# Patient Record
Sex: Male | Born: 1985 | Race: White | Hispanic: No | Marital: Single | Smoking: Former smoker
Health system: Southern US, Community
[De-identification: ages and names within clinical notes are randomized; demographics above are authoritative.]

## PROBLEM LIST (undated history)

## (undated) HISTORY — PX: ANKLE SURGERY: SHX546

## (undated) HISTORY — PX: ADENOIDECTOMY: SHX5191

---

## 2006-05-06 ENCOUNTER — Emergency Department: Payer: Self-pay | Admitting: Emergency Medicine

## 2007-06-07 ENCOUNTER — Emergency Department: Payer: Self-pay | Admitting: Emergency Medicine

## 2007-09-18 ENCOUNTER — Emergency Department: Payer: Self-pay | Admitting: Emergency Medicine

## 2008-02-16 IMAGING — CR DG HAND COMPLETE 3+V*L*
1 series · 4 of 4 positions shown · non-contrast
Comparison: none

REASON FOR EXAM: Injury
COMMENTS:

PROCEDURE:     DXR - DXR HAND LT COMPLETE  W/OBLIQUES  - May 06, 2006  [DATE]
RESULT:     Three views of the LEFT hand reveal no fracture, dislocation, or
other acute bony abnormality.  No radiopaque foreign body is identified.

[Series 1: view not recorded · 0.17mm/px · 4 of 4 slices shown]
[im 1/4]
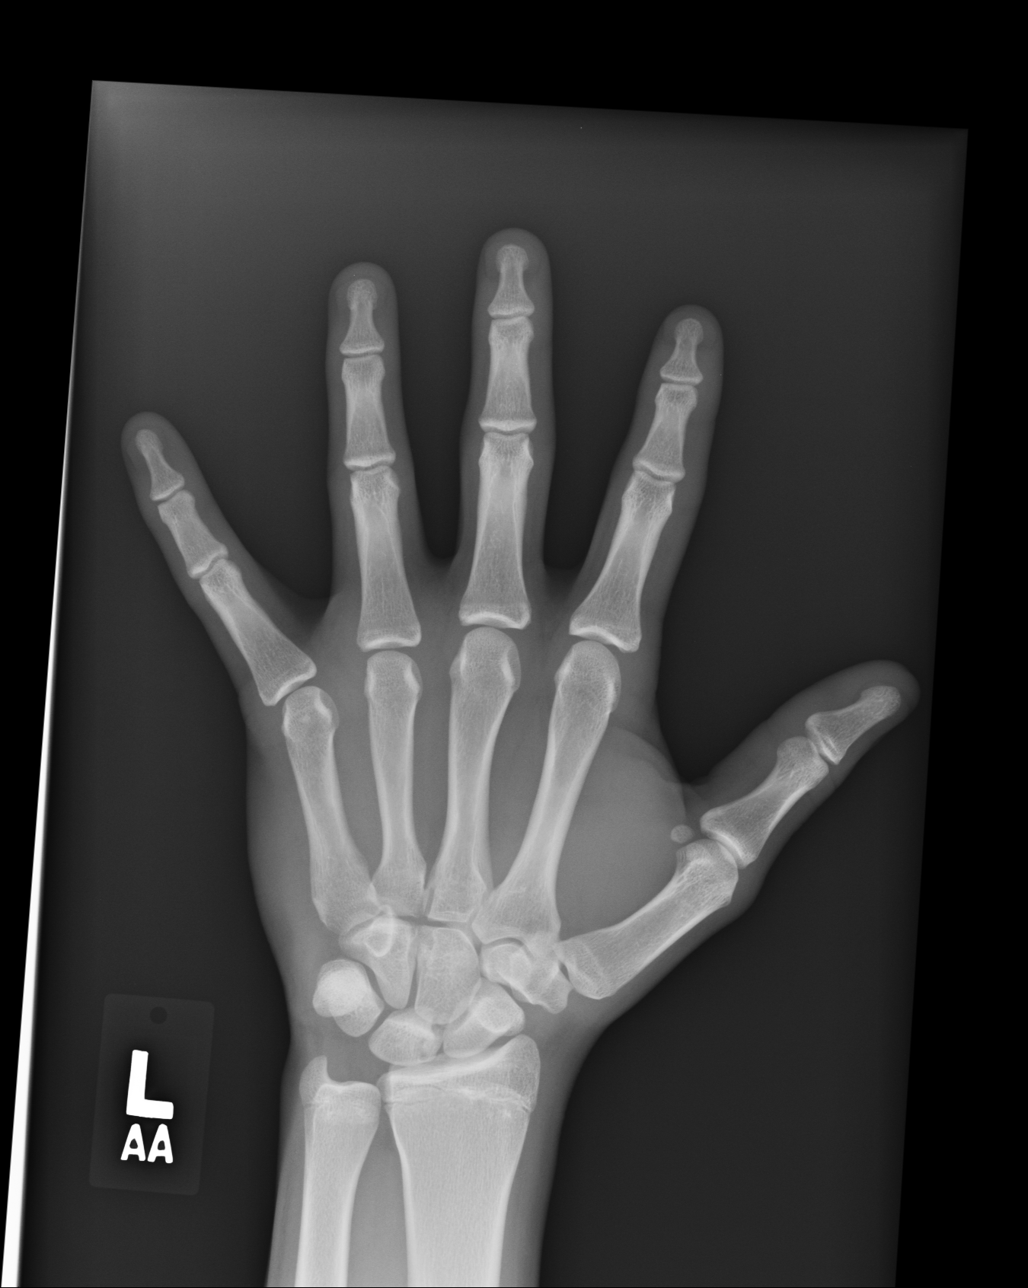
[im 2/4]
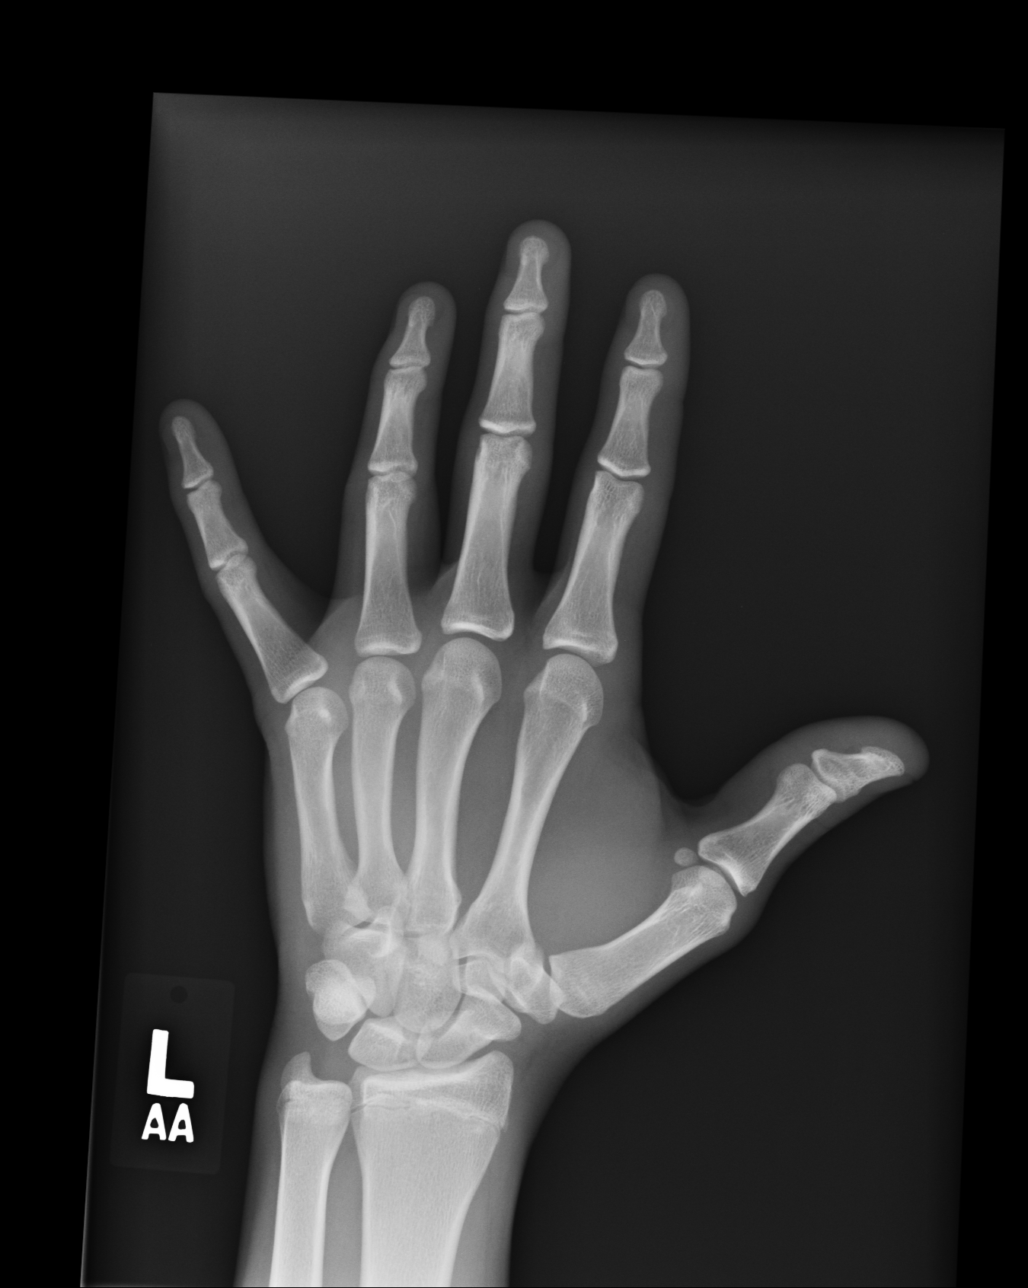
[im 3/4]
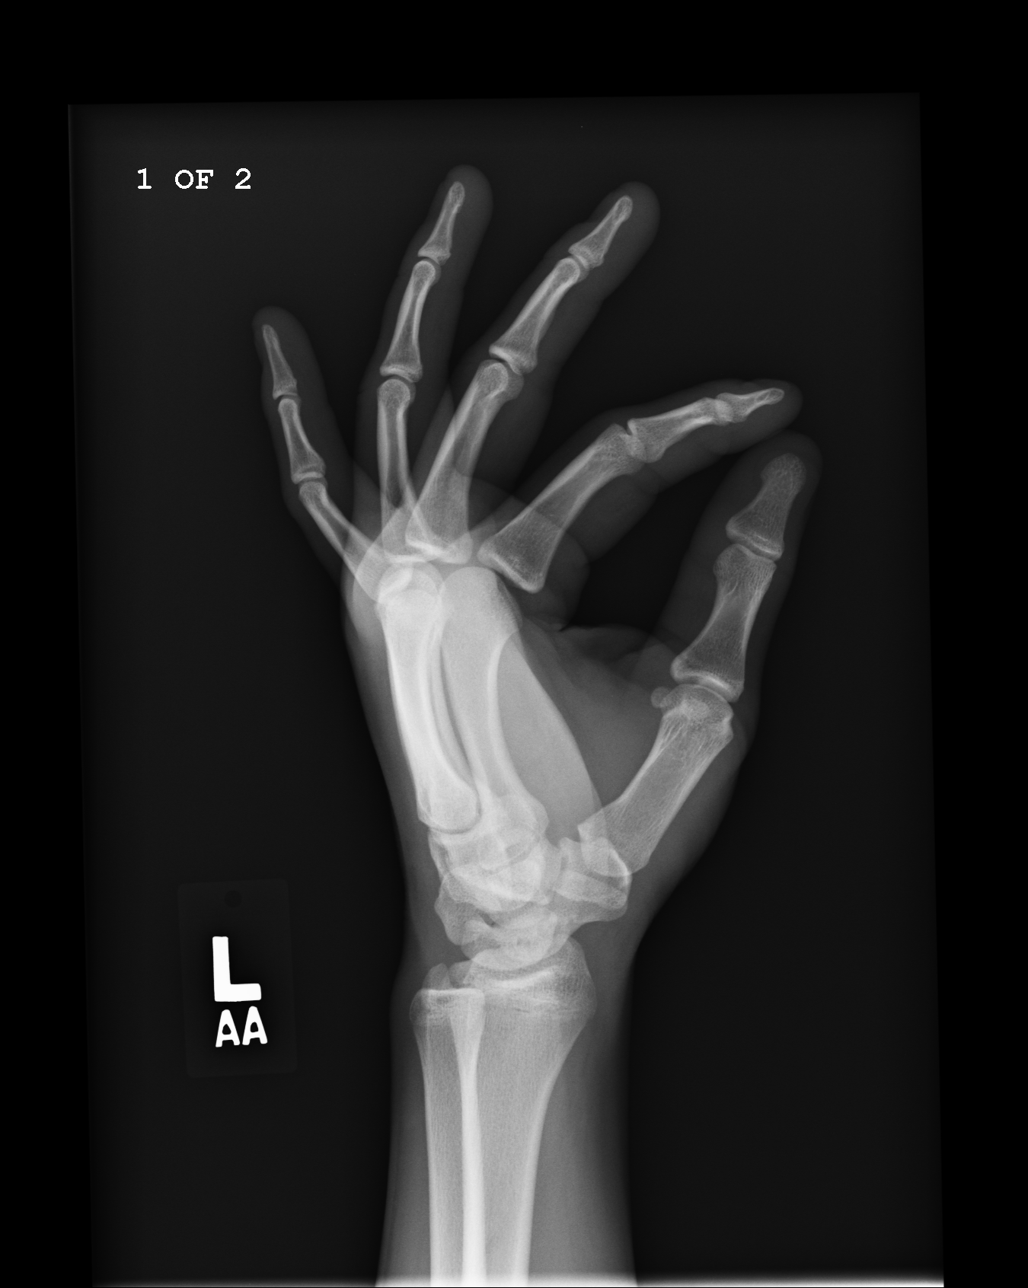
[im 4/4]
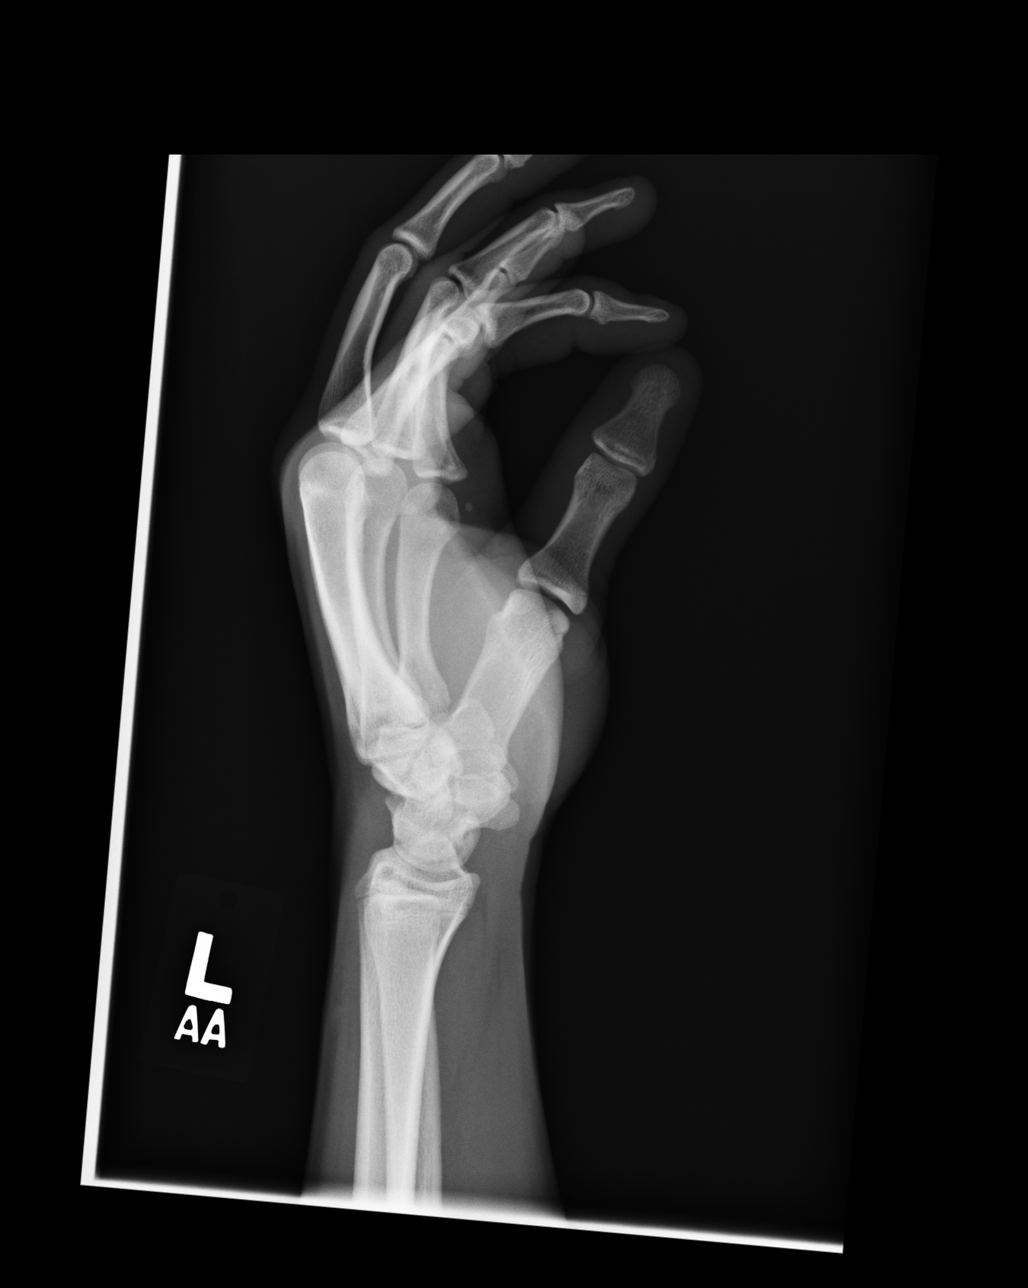

[4 of 4 positions shown; findings below may reference images not displayed]

IMPRESSION: No significant abnormalities are seen.

## 2009-09-08 ENCOUNTER — Emergency Department: Payer: Self-pay | Admitting: Internal Medicine

## 2011-03-31 ENCOUNTER — Emergency Department: Payer: Self-pay | Admitting: Internal Medicine

## 2013-03-10 ENCOUNTER — Emergency Department: Payer: Self-pay | Admitting: Emergency Medicine

## 2013-07-12 ENCOUNTER — Emergency Department: Payer: Self-pay | Admitting: Internal Medicine

## 2015-04-04 ENCOUNTER — Encounter: Payer: Self-pay | Admitting: Emergency Medicine

## 2015-04-04 ENCOUNTER — Emergency Department
Admission: EM | Admit: 2015-04-04 | Discharge: 2015-04-04 | Disposition: A | Discharge status: Home / Self Care | Payer: Self-pay | Attending: Emergency Medicine | Admitting: Emergency Medicine

## 2015-04-04 DIAGNOSIS — Z87891 Personal history of nicotine dependence: Secondary | ICD-10-CM | POA: Insufficient documentation

## 2015-04-04 DIAGNOSIS — Y9389 Activity, other specified: Secondary | ICD-10-CM | POA: Insufficient documentation

## 2015-04-04 DIAGNOSIS — X58XXXA Exposure to other specified factors, initial encounter: Secondary | ICD-10-CM | POA: Insufficient documentation

## 2015-04-04 DIAGNOSIS — S0502XA Injury of conjunctiva and corneal abrasion without foreign body, left eye, initial encounter: Secondary | ICD-10-CM | POA: Insufficient documentation

## 2015-04-04 DIAGNOSIS — Y9289 Other specified places as the place of occurrence of the external cause: Secondary | ICD-10-CM | POA: Insufficient documentation

## 2015-04-04 DIAGNOSIS — Y998 Other external cause status: Secondary | ICD-10-CM | POA: Insufficient documentation

## 2015-04-04 MED ORDER — TETRACAINE HCL 0.5 % OP SOLN
OPHTHALMIC | Status: AC
  Filled 2015-04-04: qty 2

## 2015-04-04 MED ORDER — EYE WASH OPHTH SOLN
OPHTHALMIC | Status: AC
  Filled 2015-04-04: qty 118

## 2015-04-04 MED ORDER — FLUORESCEIN SODIUM 1 MG OP STRP
ORAL_STRIP | OPHTHALMIC | Status: AC
  Filled 2015-04-04: qty 1

## 2015-04-04 MED ORDER — GENTAMICIN SULFATE 0.3 % OP SOLN: 1.0000 [drp] | Freq: Three times a day (TID) | OPHTHALMIC | Status: AC

## 2015-04-04 NOTE — Discharge Instructions (Signed)
Corneal Abrasion °The cornea is the clear covering at the front and center of the eye. When looking at the colored portion of the eye (iris), you are looking through the cornea. This very thin tissue is made up of many layers. The surface layer is a single layer of cells (corneal epithelium) and is one of the most sensitive tissues in the body. If a scratch or injury causes the corneal epithelium to come off, it is called a corneal abrasion. If the injury extends to the tissues below the epithelium, the condition is called a corneal ulcer. °CAUSES  °· Scratches. °· Trauma. °· Foreign body in the eye. °Some people have recurrences of abrasions in the area of the original injury even after it has healed (recurrent erosion syndrome). Recurrent erosion syndrome generally improves and goes away with time. °SYMPTOMS  °· Eye pain. °· Difficulty or inability to keep the injured eye open. °· The eye becomes very sensitive to light. °· Recurrent erosions tend to happen suddenly, first thing in the morning, usually after waking up and opening the eye. °DIAGNOSIS  °Your health care provider can diagnose a corneal abrasion during an eye exam. Dye is usually placed in the eye using a drop or a small paper strip moistened by your tears. When the eye is examined with a special light, the abrasion shows up clearly because of the dye. °TREATMENT  °· Small abrasions may be treated with antibiotic drops or ointment alone. °· A pressure patch may be put over the eye. If this is done, follow your doctor's instructions for when to remove the patch. Do not drive or use machines while the eye patch is on. Judging distances is hard to do with a patch on. °If the abrasion becomes infected and spreads to the deeper tissues of the cornea, a corneal ulcer can result. This is serious because it can cause corneal scarring. Corneal scars interfere with light passing through the cornea and cause a loss of vision in the involved eye. °HOME CARE  INSTRUCTIONS °· Use medicine or ointment as directed. Only take over-the-counter or prescription medicines for pain, discomfort, or fever as directed by your health care provider. °· Do not drive or operate machinery if your eye is patched. Your ability to judge distances is impaired. °· If your health care provider has given you a follow-up appointment, it is very important to keep that appointment. Not keeping the appointment could result in a severe eye infection or permanent loss of vision. If there is any problem keeping the appointment, let your health care provider know. °SEEK MEDICAL CARE IF:  °· You have pain, light sensitivity, and a scratchy feeling in one eye or both eyes. °· Your pressure patch keeps loosening up, and you can blink your eye under the patch after treatment. °· Any kind of discharge develops from the eye after treatment or if the lids stick together in the morning. °· You have the same symptoms in the morning as you did with the original abrasion days, weeks, or months after the abrasion healed. °MAKE SURE YOU:  °· Understand these instructions. °· Will watch your condition. °· Will get help right away if you are not doing well or get worse. °Document Released: 10/12/2000 Document Revised: 10/20/2013 Document Reviewed: 06/22/2013 °[employer]® Patient Information ©2015 [employer]. This information is not intended to replace advice given to you by your health care provider. Make sure you discuss any questions you have with your health care provider. ° °

## 2015-04-04 NOTE — ED Notes (Signed)
States thinks got grass in eye

## 2015-04-04 NOTE — ED Provider Notes (Signed)
[facility] Emergency Department Provider Note  ____________________________________________  Time seen: Approximately 9:38 AM  I have reviewed the triage vital signs and the nursing notes.   HISTORY  Chief Complaint Eye Pain    HPI Justin Mata is a 29 y.o. male the 5 pain secondary to suspected foreign body were current grass yesterday. Patient stating he's been a especially increasing foreign body sensation to the inside of his left upper eye. Denies any acute vision change. Patient state is more discomfort than pain which he rates it a 1/10. Patient admits to not wear protective goggles while cutting grass.   History reviewed. No pertinent past medical history.  There are no active problems to display for this patient.   No past surgical history on file.  No current outpatient prescriptions on file.  Allergies Review of patient's allergies indicates no known allergies.  No family history on file.  Social History History  Substance Use Topics  . Smoking status: Former Games developermoker  . Smokeless tobacco: Not on file  . Alcohol Use: No    Review of Systems Constitutional: No fever/chills Eyes: No visual changes. Foreign body sensation left eye. ENT: No sore throat. Cardiovascular: Denies chest pain. Respiratory: Denies shortness of breath. Gastrointestinal: No abdominal pain.  No nausea, no vomiting.  No diarrhea.  No constipation. Genitourinary: Negative for dysuria. Musculoskeletal: Negative for back pain. Skin: Negative for rash. Neurological: Negative for headaches, focal weakness or numbness. 10-point ROS otherwise negative.  ____________________________________________   PHYSICAL EXAM:  VITAL SIGNS: ED Triage Vitals  Enc Vitals Group     BP 04/04/15 0909 128/75 mmHg     Pulse Rate 04/04/15 0909 66     Resp 04/04/15 0909 20     Temp 04/04/15 0909 97.9 F (36.6 C)     Temp Source 04/04/15 0909 Oral     SpO2 04/04/15 0909 97 %      Weight 04/04/15 0909 175 lb (79.379 kg)     Height 04/04/15 0909 5\' 6"  (1.676 m)     Head Cir --      Peak Flow --      Pain Score 04/04/15 0909 1     Pain Loc --      Pain Edu? --      Excl. in GC? --     Constitutional: Alert and oriented. Well appearing and in no acute distress. Eyes: Conjunctivae are normal. PERRL. EOMI. see nurse's notes for visual acuity. Tetracaine drops were used to anesthetize the eye. There was uptake of the dye revealing a small round corneal abrasion is. Aspect of the left pupil.  Head: Atraumatic. Nose: No congestion/rhinnorhea. Mouth/Throat: Mucous membranes are moist.  Oropharynx non-erythematous. Neck: No stridor.  No deformity for nuchal range of motion. Nontender palpation. Hematological/Lymphatic/Immunilogical: No cervical lymphadenopathy. Cardiovascular: Normal rate, regular rhythm. Grossly normal heart sounds.  Good peripheral circulation. Respiratory: Normal respiratory effort.  No retractions. Lungs CTAB. Gastrointestinal: Soft and nontender. No distention. No abdominal bruits. No CVA tenderness. Musculoskeletal: No lower extremity tenderness nor edema.  No joint effusions. Neurologic:  Normal speech and language. No gross focal neurologic deficits are appreciated. Speech is normal. No gait instability. Skin:  Skin is warm, dry and intact. No rash noted. Psychiatric: Mood and affect are normal. Speech and behavior are normal.  ____________________________________________   LABS (all labs ordered are listed, but only abnormal results are displayed)  Labs Reviewed - No data to display ____________________________________________  EKG   ____________________________________________  RADIOLOGY  ____________________________________________   PROCEDURES  Procedure(s) performed: None  Critical Care performed: No  ____________________________________________   INITIAL IMPRESSION / ASSESSMENT AND PLAN / ED COURSE  Pertinent  labs & imaging results that were available during my care of the patient were reviewed by me and considered in my medical decision making (see chart for details).  Left corneal abrasion. ____________________________________________   FINAL CLINICAL IMPRESSION(S) / ED DIAGNOSES  Final diagnoses:  Corneal abrasion, left, initial encounter       Joni Reining, PA-C 04/04/15 [id number]  Sharyn Creamer, MD 04/04/15 [phone]

## 2015-04-24 IMAGING — CR DG HAND COMPLETE 3+V*L*
1 series · 3 of 3 positions shown · non-contrast
Comparison: none

REASON FOR EXAM: laceration
COMMENTS:

PROCEDURE:     DXR - DXR HAND LT COMPLETE  W/OBLIQUES  - July 12, 2013  [DATE]
RESULT:

[Series 1: pa · 0.17mm/px · 3 of 3 slices shown]
[im 1/3]
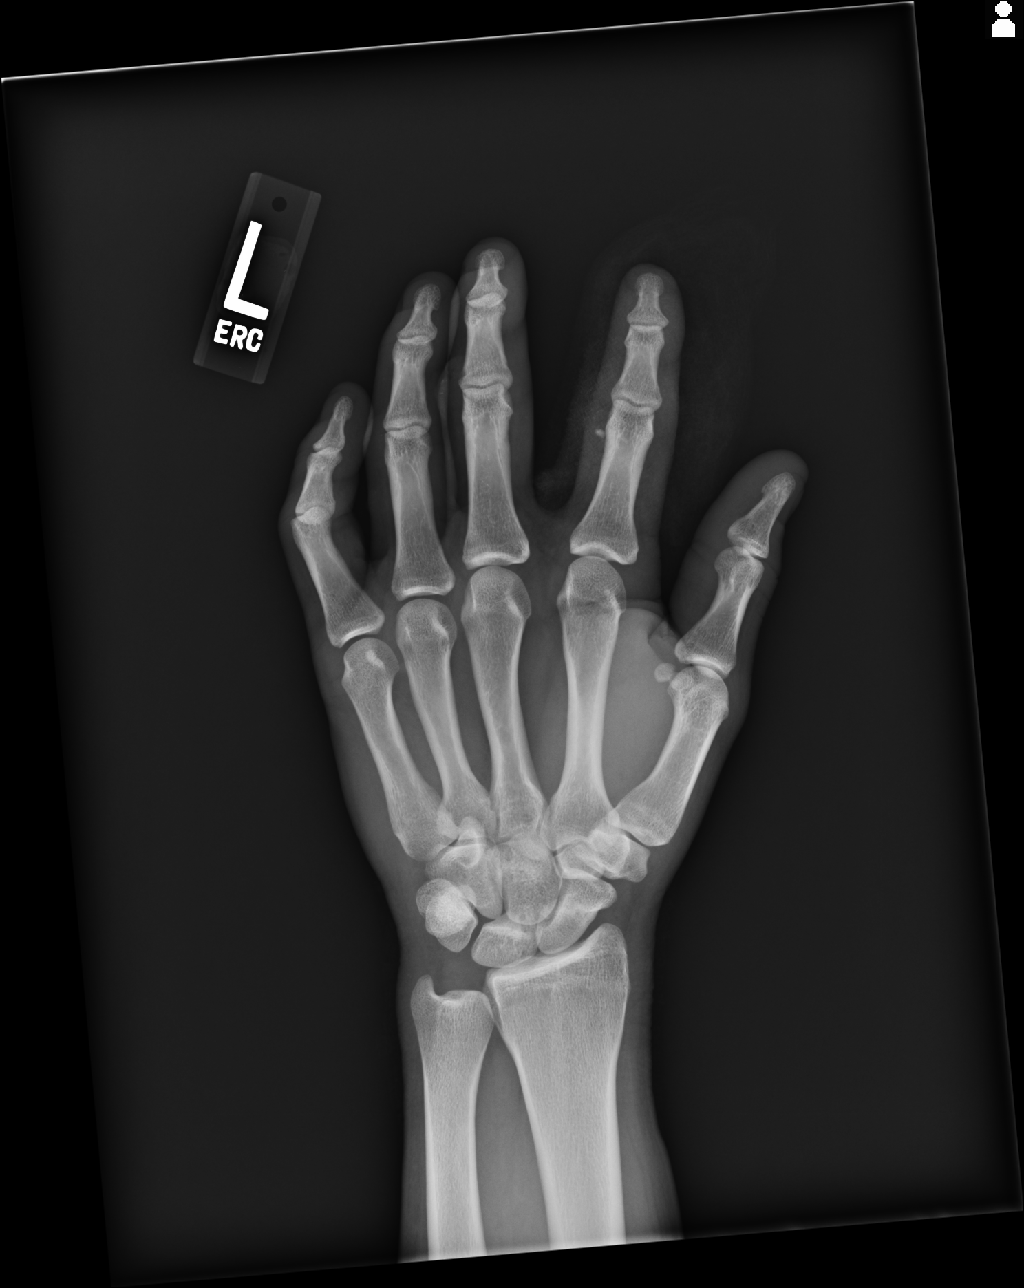
[im 2/3]
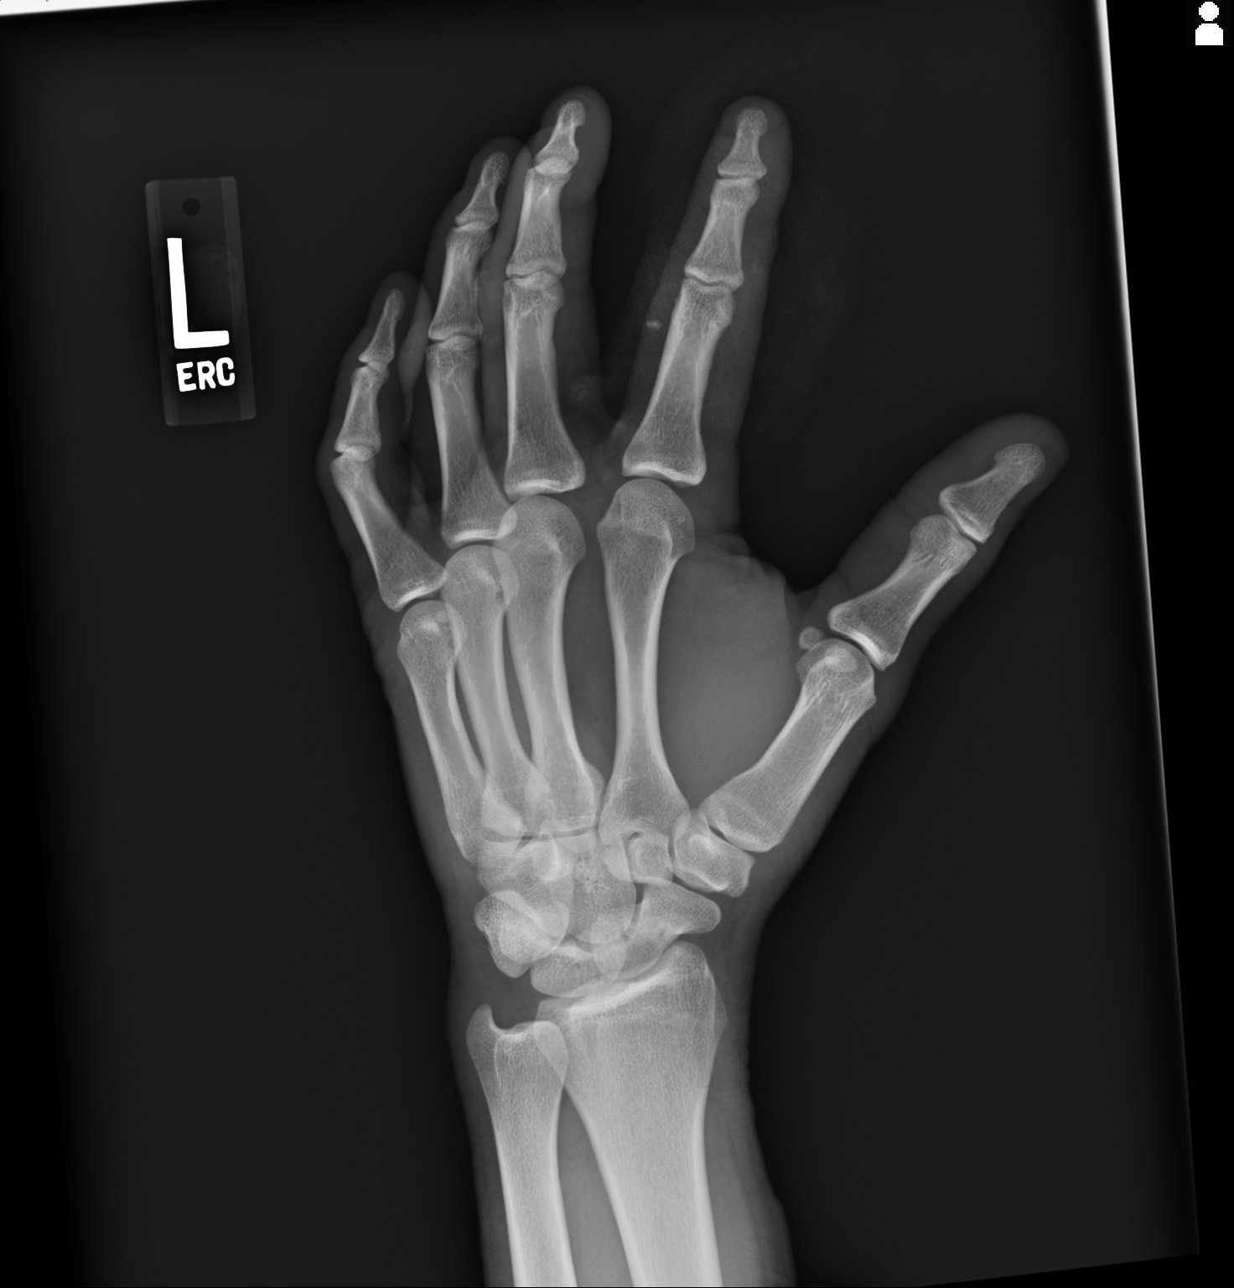
[im 3/3]
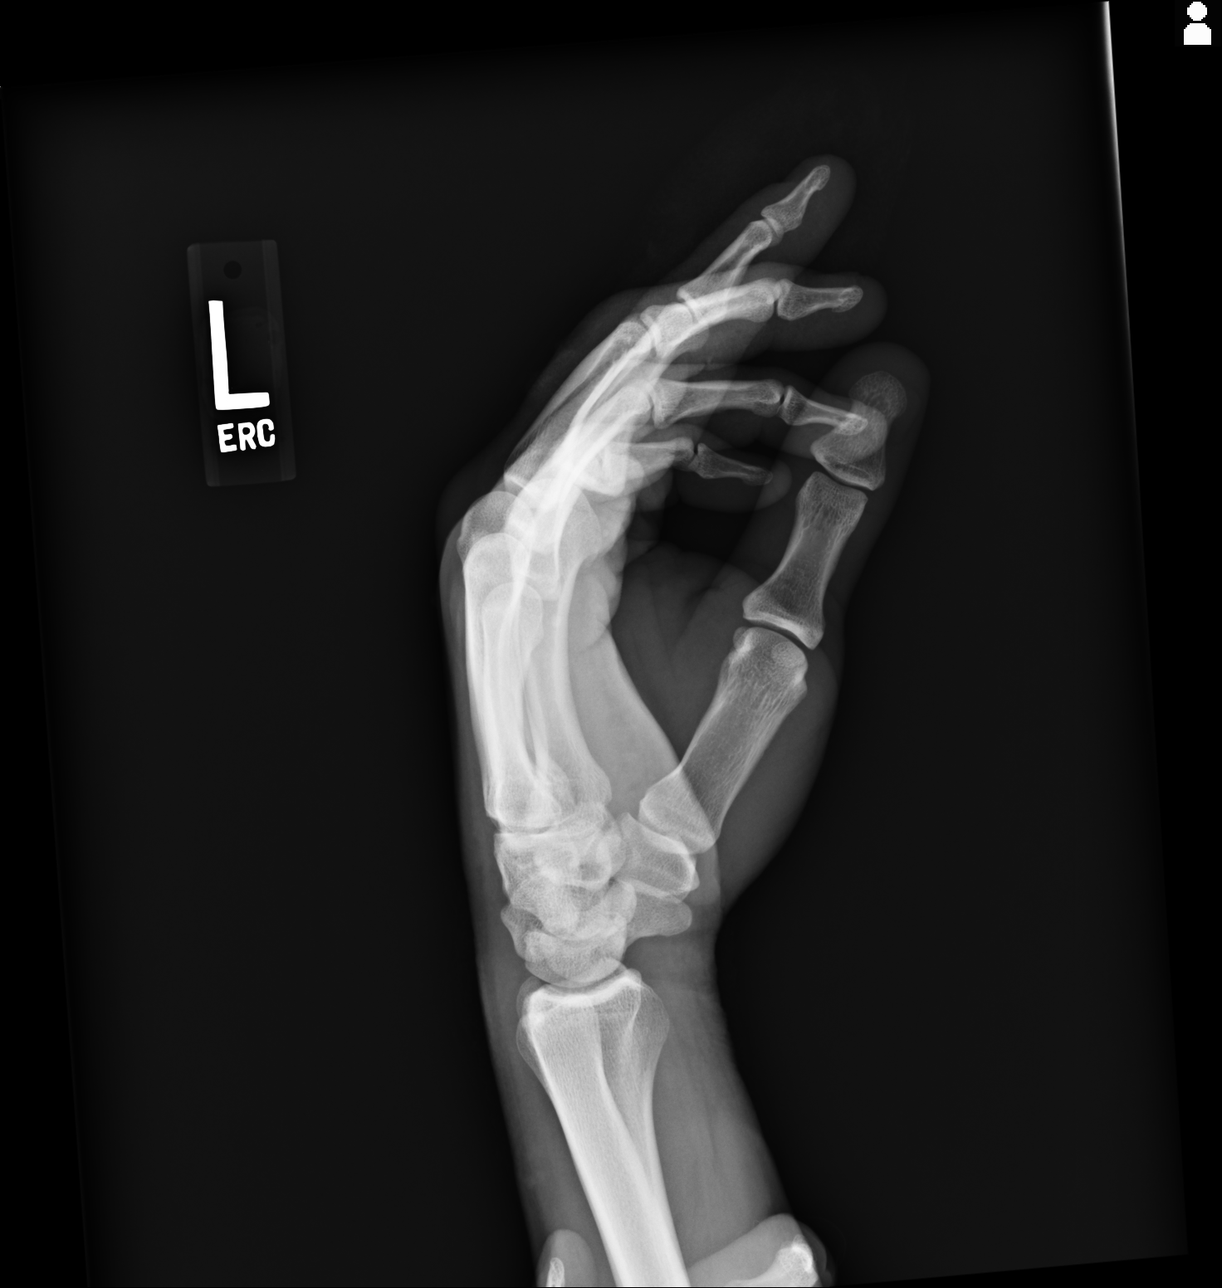

[3 of 3 positions shown; findings below may reference images not displayed]

FINDINGS: A small amorphous area of increased density projects in the soft
tissues along the periphery of the ulnar side of the proximal phalanx second
digit. This may represent external artifact or possibly foreign body within
the subcutaneous tissues. There is otherwise no evidence of acute fracture
or dislocation.
IMPRESSION: 1. Findings which may represent a small foreign body within the subcutaneous
tissues along the ulnar aspect of the proximal phalanx second digit.
2. No evidence of acute osseous abnormalities. This area representing an
avulsion fragment cannot be completely excluded though there does not appear
to be a definite donor site, which makes avulsion of lower differential
consideration.

## 2016-10-19 ENCOUNTER — Emergency Department
Admission: EM | Admit: 2016-10-19 | Discharge: 2016-10-19 | Disposition: A | Discharge status: Home / Self Care | Payer: Self-pay | Attending: Emergency Medicine | Admitting: Emergency Medicine

## 2016-10-19 DIAGNOSIS — Z87891 Personal history of nicotine dependence: Secondary | ICD-10-CM | POA: Insufficient documentation

## 2016-10-19 DIAGNOSIS — Z792 Long term (current) use of antibiotics: Secondary | ICD-10-CM | POA: Insufficient documentation

## 2016-10-19 DIAGNOSIS — Z202 Contact with and (suspected) exposure to infections with a predominantly sexual mode of transmission: Secondary | ICD-10-CM | POA: Insufficient documentation

## 2016-10-19 LAB — CHLAMYDIA/NGC RT PCR (ARMC ONLY)
Chlamydia Tr: NOT DETECTED
N gonorrhoeae: NOT DETECTED

## 2016-10-19 LAB — URINALYSIS, COMPLETE (UACMP) WITH MICROSCOPIC
BACTERIA UA: NONE SEEN
Bilirubin Urine: NEGATIVE
GLUCOSE, UA: NEGATIVE mg/dL
Hgb urine dipstick: NEGATIVE
KETONES UR: NEGATIVE mg/dL
Leukocytes, UA: NEGATIVE
Nitrite: NEGATIVE
PROTEIN: NEGATIVE mg/dL
Specific Gravity, Urine: 1.023 (ref 1.005–1.030)
pH: 6 (ref 5.0–8.0)

## 2016-10-19 NOTE — ED Notes (Signed)
Sent urine to lab

## 2016-10-19 NOTE — Discharge Instructions (Signed)
Urinalysis was negative. This facility will contact you telephonically and chlamydia and gonorrhea positive.

## 2016-10-19 NOTE — ED Notes (Signed)
Pt discharged home after verbalizing understanding of discharge instructions; nad noted. 

## 2016-10-19 NOTE — ED Triage Notes (Signed)
Pt states that he found out that someone he was sexually active with has chlamydia. Denies any sx. Pt alert and oriented X4, active, cooperative, pt in NAD. RR even and unlabored, color WNL.

## 2016-10-19 NOTE — ED Provider Notes (Signed)
[facility] Emergency Department Provider Note   ____________________________________________   First MD Initiated Contact with Patient 10/19/16 1152     (approximate)  I have reviewed the triage vital signs and the nursing notes.   HISTORY  Chief Complaint Exposure to STD    HPI Justin Mata is a 30 y.o. male patient states he was contacted by sexual partner and told she had chlamydia. Patient state last sexual contact was 4 days ago. Patient states asymptomatic.   History reviewed. No pertinent past medical history.  There are no active problems to display for this patient.   History reviewed. No pertinent surgical history.  Prior to Admission medications   Medication Sig Start Date End Date Taking? Authorizing Provider  gentamicin (GARAMYCIN) 0.3 % ophthalmic solution Place 1 drop into the left eye 3 (three) times daily. 04/04/15   Joni Reiningonald K Syler Norcia, PA-C    Allergies Patient has no known allergies.  No family history on file.  Social History Social History  Substance Use Topics  . Smoking status: Former Games developermoker  . Smokeless tobacco: Not on file  . Alcohol use No    Review of Systems Constitutional: No fever/chills Eyes: No visual changes. ENT: No sore throat. Cardiovascular: Denies chest pain. Respiratory: Denies shortness of breath. Gastrointestinal: No abdominal pain.  No nausea, no vomiting.  No diarrhea.  No constipation. Genitourinary: Negative for dysuria. Musculoskeletal: Negative for back pain. Skin: Negative for rash. Neurological: Negative for headaches, focal weakness or numbness.   ____________________________________________   PHYSICAL EXAM:  VITAL SIGNS: ED Triage Vitals [10/19/16 1131]  Enc Vitals Group     BP (!) 148/90     Pulse Rate 67     Resp 18     Temp 97.7 F (36.5 C)     Temp Source Oral     SpO2 100 %     Weight 175 lb (79.4 kg)     Height 5\' 6"  (1.676 m)     Head Circumference      Peak  Flow      Pain Score      Pain Loc      Pain Edu?      Excl. in GC?     Constitutional: Alert and oriented. Well appearing and in no acute distress. Eyes: Conjunctivae are normal. PERRL. EOMI. Head: Atraumatic. Nose: No congestion/rhinnorhea. Mouth/Throat: Mucous membranes are moist.  Oropharynx non-erythematous. Neck: No stridor.  No cervical spine tenderness to palpation. Hematological/Lymphatic/Immunilogical: No cervical lymphadenopathy. Cardiovascular: Normal rate, regular rhythm. Grossly normal heart sounds.  Good peripheral circulation. Respiratory: Normal respiratory effort.  No retractions. Lungs CTAB. Gastrointestinal: Soft and nontender. No distention. No abdominal bruits. No CVA tenderness. Genitourinary: No penial lesions or adenopathy. Musculoskeletal: No lower extremity tenderness nor edema.  No joint effusions. Neurologic:  Normal speech and language. No gross focal neurologic deficits are appreciated. No gait instability. Skin:  Skin is warm, dry and intact. No rash noted. Psychiatric: Mood and affect are normal. Speech and behavior are normal.  ____________________________________________   LABS (all labs ordered are listed, but only abnormal results are displayed)  Labs Reviewed  URINALYSIS, COMPLETE (UACMP) WITH MICROSCOPIC - Abnormal; Notable for the following:       Result Value   Color, Urine YELLOW (*)    APPearance CLEAR (*)    Squamous Epithelial / LPF 0-5 (*)    All other components within normal limits  CHLAMYDIA/NGC RT PCR [facility] ONLY)   ____________________________________________  EKG  ____________________________________________  RADIOLOGY   ____________________________________________   PROCEDURES  Procedure(s) performed: None  Procedures  Critical Care performed: No  ____________________________________________   INITIAL IMPRESSION / ASSESSMENT AND PLAN / ED COURSE  Pertinent labs & imaging results that were  available during my care of the patient were reviewed by me and considered in my medical decision making (see chart for details).  STD exposure. Patient discharged care instructions. Patient advised follow-up with health department.  Clinical Course   Patient if STD exposure. Patient denies any signs and symptoms of STD. Physical exam reveals no penial lesions or adenopathy. Patient urinalysis was unremarkable for bacteria. Patient advised to chlamydia and gonorrhea is pending. Patient will be notified telephonically these tests a positive advised to come back for treatment.   ____________________________________________   FINAL CLINICAL IMPRESSION(S) / ED DIAGNOSES  Final diagnoses:  STD exposure      NEW MEDICATIONS STARTED DURING THIS VISIT:  New Prescriptions   No medications on file     Note:  This document was prepared using Dragon voice recognition software and may include unintentional dictation errors.    Joni Reiningonald K Shakala Marlatt, PA-C 10/19/16 1235    Governor Rooksebecca Lord, MD 10/19/16 [phone]

## 2017-03-15 ENCOUNTER — Telehealth: Payer: Self-pay | Admitting: Emergency Medicine

## 2017-03-15 NOTE — Telephone Encounter (Signed)
Patient called asking for std resutls from December.  Gave him results.

## 2018-05-09 ENCOUNTER — Encounter: Payer: Self-pay | Admitting: Emergency Medicine

## 2018-05-09 ENCOUNTER — Emergency Department
Admission: EM | Admit: 2018-05-09 | Discharge: 2018-05-09 | Disposition: A | Discharge status: Home / Self Care | Payer: Self-pay | Attending: Emergency Medicine | Admitting: Emergency Medicine

## 2018-05-09 ENCOUNTER — Other Ambulatory Visit: Payer: Self-pay

## 2018-05-09 DIAGNOSIS — Z87891 Personal history of nicotine dependence: Secondary | ICD-10-CM | POA: Insufficient documentation

## 2018-05-09 DIAGNOSIS — T63441A Toxic effect of venom of bees, accidental (unintentional), initial encounter: Secondary | ICD-10-CM | POA: Insufficient documentation

## 2018-05-09 DIAGNOSIS — Z79899 Other long term (current) drug therapy: Secondary | ICD-10-CM | POA: Insufficient documentation

## 2018-05-09 DIAGNOSIS — F410 Panic disorder [episodic paroxysmal anxiety] without agoraphobia: Secondary | ICD-10-CM

## 2018-05-09 MED ORDER — HYDROXYZINE HCL 25 MG PO TABS: 50.0000 mg | ORAL_TABLET | Freq: Once | ORAL | Status: DC

## 2018-05-09 MED ORDER — HYDROXYZINE PAMOATE 50 MG PO CAPS: 50.0000 mg | ORAL_CAPSULE | Freq: Three times a day (TID) | ORAL | 0 refills | Status: AC | PRN

## 2018-05-09 NOTE — ED Notes (Signed)
First Nurse Note:  C/O worsening episodes of anxiety.  Patient is AAOx3.  Skin warm and dry. NAD

## 2018-05-09 NOTE — ED Triage Notes (Signed)
Pt presents to ED with c/o being stung by a bee approx 30 mins PTA. Pt states he feels increasingly anxious regarding having an allergic reaction to the bee sting however denies any known allergies. Pt is noted to have mildly pressured speech at this time. Pt states, "I know my heart rates up, I can feel it!" VSS, pt noted to be hypertensive at this time. Pt denies hives, swelling to his throat, difficulty breathing or speaking.

## 2018-05-09 NOTE — Discharge Instructions (Signed)
It was a pleasure to take care of you today, and thank you for coming to our emergency department.  If you have any questions or concerns before leaving please ask the nurse to grab me and I'm more than happy to go through your aftercare instructions again. ° °If you were prescribed any opioid pain medication today such as Norco, Vicodin, Percocet, morphine, hydrocodone, or oxycodone please make sure you do not drive when you are taking this medication as it can alter your ability to drive safely. ° °If you have any concerns once you are home that you are not improving or are in fact getting worse before you can make it to your follow-up appointment, please do not hesitate to call 911 and come back for further evaluation. ° °Brinae Woods, MD ° ° ° °

## 2018-05-09 NOTE — ED Provider Notes (Signed)
[facility] Emergency Department Provider Note  ____________________________________________   First MD Initiated Contact with Patient 05/09/18 1840     (approximate)  I have reviewed the triage vital signs and the nursing notes.   HISTORY  Chief Complaint Anxiety   HPI Justin Mata is a 32 y.o. male who comes to the emergency department after having a panic attack.  He was stung by a bee on his right posterior shoulder and thereafter had sudden onset sharp aching pain.  The pain is now resolved however he began to hyperventilate.  Some shortness of breath but no throat tightening.  He has a long-standing history of anxiety which is untreated.    History reviewed. No pertinent past medical history.  There are no active problems to display for this patient.   Past Surgical History:  Procedure Laterality Date  . ADENOIDECTOMY    . ANKLE SURGERY      Prior to Admission medications   Medication Sig Start Date End Date Taking? Authorizing Provider  gentamicin (GARAMYCIN) 0.3 % ophthalmic solution Place 1 drop into the left eye 3 (three) times daily. 04/04/15   Joni Reining, PA-C  hydrOXYzine (VISTARIL) 50 MG capsule Take 1 capsule (50 mg total) by mouth 3 (three) times daily as needed for itching or anxiety. 05/09/18   Merrily Brittle, MD    Allergies Patient has no known allergies.  History reviewed. No pertinent family history.  Social History Social History   Tobacco Use  . Smoking status: Former Games developer  . Smokeless tobacco: Never Used  Substance Use Topics  . Alcohol use: Yes    Comment: Socially  . Drug use: Never    Review of Systems Constitutional: No fever/chills ENT: No sore throat. Cardiovascular: Positive for chest pain. Respiratory: Positive for shortness of breath. Gastrointestinal: No abdominal pain.  Positive for nausea, no vomiting.  No diarrhea.  No constipation. Musculoskeletal: Negative for back pain. Neurological:  Negative for headaches   ____________________________________________   PHYSICAL EXAM:  VITAL SIGNS: ED Triage Vitals [05/09/18 1732]  Enc Vitals Group     BP (!) 162/99     Pulse Rate 88     Resp 18     Temp 97.6 F (36.4 C)     Temp Source Oral     SpO2 100 %     Weight 170 lb (77.1 kg)     Height 5\' 6"  (1.676 m)     Head Circumference      Peak Flow      Pain Score 0     Pain Loc      Pain Edu?      Excl. in GC?     Constitutional: Alert and oriented x4 somewhat anxious appearing Head: Atraumatic. Nose: No congestion/rhinnorhea. Mouth/Throat: No trismus Neck: No stridor.   Cardiovascular: Rate and rhythm Respiratory: Normal respiratory effort.  No retractions. Abdomen: Soft nontender Neurologic:  Normal speech and language. No gross focal neurologic deficits are appreciated.  Skin: Bee sting to right posterior shoulder with no evidence of allergic reaction    ____________________________________________  LABS (all labs ordered are listed, but only abnormal results are displayed)  Labs Reviewed - No data to display   __________________________________________  EKG  ED ECG REPORT I, Merrily Brittle, the attending physician, personally viewed and interpreted this ECG.  Date: 05/10/2018 EKG Time:  Rate: 93 Rhythm: normal sinus rhythm QRS Axis: Rightward axis which is old Intervals: normal ST/T Wave abnormalities: normal Narrative Interpretation: no  evidence of acute ischemia  ____________________________________________  RADIOLOGY   ____________________________________________   DIFFERENTIAL includes but not limited to  Panic attack, anxiety, allergic reaction, bee sting   PROCEDURES  Procedure(s) performed: no  Procedures  Critical Care performed: no  ____________________________________________   INITIAL IMPRESSION / ASSESSMENT AND PLAN / ED COURSE  Pertinent labs & imaging results that were available during my care of the  patient were reviewed by me and considered in my medical decision making (see chart for details).   The patient is clearly not anaphylactic.  He has a simple bee sting and a panic attack thereafter.  Given hydroxyzine with improvement in symptoms and I will refer him to [facility] and give a short prescription of hydroxyzine.      ____________________________________________   FINAL CLINICAL IMPRESSION(S) / ED DIAGNOSES  Final diagnoses:  Bee sting, accidental or unintentional, initial encounter  Panic attack      NEW MEDICATIONS STARTED DURING THIS VISIT:  Discharge Medication List as of 05/09/2018  6:48 PM    START taking these medications   Details  hydrOXYzine (VISTARIL) 50 MG capsule Take 1 capsule (50 mg total) by mouth 3 (three) times daily as needed for itching or anxiety., Starting Fri 05/09/2018, Print         Note:  This document was prepared using Dragon voice recognition software and may include unintentional dictation errors.      Merrily Brittleifenbark, Morissa Obeirne, MD 05/10/18 [phone]

## 2018-05-09 NOTE — ED Notes (Signed)
Pt's care discussed with Dr. Lamont Snowballifenbark, per Dr. Lamont Snowballifenbark no blood work at this time. EKG obtained due to patient beginning to mess with his chest, and intermittently tachycardic.

## 2023-12-17 ENCOUNTER — Ambulatory Visit: Payer: Self-pay | Admitting: Pediatrics
